# Patient Record
Sex: Female | Born: 1970 | Race: White | Hispanic: No | Marital: Married | Smoking: Never smoker
Health system: Southern US, Community
[De-identification: ages and names within clinical notes are randomized; demographics above are authoritative.]

## PROBLEM LIST (undated history)

## (undated) DIAGNOSIS — I10 Essential (primary) hypertension: Secondary | ICD-10-CM

## (undated) HISTORY — PX: NO PAST SURGERIES: SHX2092

## (undated) HISTORY — PX: COLONOSCOPY: SHX174

---

## 2006-08-31 ENCOUNTER — Ambulatory Visit: Payer: Self-pay | Admitting: Obstetrics and Gynecology

## 2009-01-16 ENCOUNTER — Ambulatory Visit: Payer: Self-pay

## 2009-11-18 ENCOUNTER — Ambulatory Visit: Payer: Self-pay | Admitting: Internal Medicine

## 2010-02-27 ENCOUNTER — Ambulatory Visit: Payer: Self-pay

## 2011-03-05 ENCOUNTER — Ambulatory Visit: Payer: Self-pay

## 2011-03-28 ENCOUNTER — Ambulatory Visit: Payer: Self-pay | Admitting: Neurology

## 2012-03-08 ENCOUNTER — Ambulatory Visit: Payer: Self-pay

## 2013-05-11 ENCOUNTER — Ambulatory Visit: Payer: Self-pay

## 2014-06-06 ENCOUNTER — Ambulatory Visit: Payer: Self-pay

## 2015-06-17 ENCOUNTER — Other Ambulatory Visit: Payer: Self-pay | Admitting: Obstetrics and Gynecology

## 2015-06-17 DIAGNOSIS — Z1231 Encounter for screening mammogram for malignant neoplasm of breast: Secondary | ICD-10-CM

## 2015-06-19 ENCOUNTER — Ambulatory Visit
Admission: RE | Admit: 2015-06-19 | Discharge: 2015-06-19 | Disposition: A | Discharge status: Home / Self Care | Payer: Managed Care, Other (non HMO) | Source: Ambulatory Visit | Attending: Obstetrics and Gynecology | Admitting: Obstetrics and Gynecology

## 2015-06-19 DIAGNOSIS — Z1231 Encounter for screening mammogram for malignant neoplasm of breast: Secondary | ICD-10-CM | POA: Insufficient documentation

## 2016-08-04 ENCOUNTER — Other Ambulatory Visit: Payer: Self-pay | Admitting: Obstetrics and Gynecology

## 2016-08-04 DIAGNOSIS — Z1231 Encounter for screening mammogram for malignant neoplasm of breast: Secondary | ICD-10-CM

## 2016-08-26 ENCOUNTER — Ambulatory Visit
Admission: RE | Admit: 2016-08-26 | Discharge: 2016-08-26 | Disposition: A | Discharge status: Home / Self Care | Payer: BLUE CROSS/BLUE SHIELD | Source: Ambulatory Visit | Attending: Obstetrics and Gynecology | Admitting: Obstetrics and Gynecology

## 2016-08-26 DIAGNOSIS — Z1231 Encounter for screening mammogram for malignant neoplasm of breast: Secondary | ICD-10-CM | POA: Insufficient documentation

## 2017-01-26 ENCOUNTER — Other Ambulatory Visit: Payer: Self-pay | Admitting: Ophthalmology

## 2017-01-26 DIAGNOSIS — H53469 Homonymous bilateral field defects, unspecified side: Secondary | ICD-10-CM

## 2017-01-29 ENCOUNTER — Ambulatory Visit: Payer: BLUE CROSS/BLUE SHIELD

## 2017-02-09 ENCOUNTER — Ambulatory Visit
Admission: RE | Admit: 2017-02-09 | Discharge: 2017-02-09 | Disposition: A | Discharge status: Home / Self Care | Payer: BLUE CROSS/BLUE SHIELD | Source: Ambulatory Visit | Attending: Ophthalmology | Admitting: Ophthalmology

## 2017-02-09 DIAGNOSIS — R9082 White matter disease, unspecified: Secondary | ICD-10-CM | POA: Diagnosis not present

## 2017-02-09 DIAGNOSIS — H53469 Homonymous bilateral field defects, unspecified side: Secondary | ICD-10-CM | POA: Diagnosis present

## 2017-02-09 DIAGNOSIS — Q283 Other malformations of cerebral vessels: Secondary | ICD-10-CM | POA: Diagnosis not present

## 2017-02-09 MED ORDER — GADOBENATE DIMEGLUMINE 529 MG/ML IV SOLN
20.0000 mL | Freq: Once | INTRAVENOUS | Status: AC | PRN
Start: 2017-02-09 — End: 2017-02-09
  Administered 2017-02-09: 20 mL via INTRAVENOUS

## 2017-08-05 ENCOUNTER — Other Ambulatory Visit: Payer: Self-pay | Admitting: Obstetrics and Gynecology

## 2017-08-05 DIAGNOSIS — Z1231 Encounter for screening mammogram for malignant neoplasm of breast: Secondary | ICD-10-CM

## 2017-08-30 ENCOUNTER — Encounter (INDEPENDENT_AMBULATORY_CARE_PROVIDER_SITE_OTHER): Payer: Self-pay

## 2017-08-30 ENCOUNTER — Ambulatory Visit
Admission: RE | Admit: 2017-08-30 | Discharge: 2017-08-30 | Disposition: A | Discharge status: Home / Self Care | Payer: BLUE CROSS/BLUE SHIELD | Source: Ambulatory Visit | Attending: Infectious Diseases | Admitting: Infectious Diseases

## 2017-08-30 DIAGNOSIS — Z1231 Encounter for screening mammogram for malignant neoplasm of breast: Secondary | ICD-10-CM | POA: Diagnosis present

## 2018-03-31 ENCOUNTER — Ambulatory Visit
Admission: EM | Admit: 2018-03-31 | Discharge: 2018-03-31 | Disposition: A | Discharge status: Home / Self Care | Payer: PRIVATE HEALTH INSURANCE | Attending: Family Medicine | Admitting: Family Medicine

## 2018-03-31 DIAGNOSIS — R21 Rash and other nonspecific skin eruption: Secondary | ICD-10-CM | POA: Insufficient documentation

## 2018-03-31 MED ORDER — PREDNISONE 10 MG PO TABS: ORAL_TABLET | ORAL | 0 refills | Status: AC

## 2018-03-31 NOTE — Discharge Instructions (Signed)
Prednisone as prescribed.  Benadryl if needed.  Take care  Dr. Adriana Simas

## 2018-03-31 NOTE — ED Triage Notes (Signed)
Pt reports red rash noted to left neck that started this morning upon waking. Pt describes rash as itchy and uncomfortable

## 2018-03-31 NOTE — ED Provider Notes (Signed)
[facility]    CSN: [id number] Arrival date & time: 03/31/18  1247  History   Chief Complaint Chief Complaint  Patient presents with  . Rash   HPI  48 year old female presents with rash.  Patient reports that she developed rash around her neck this morning.  It has now spread upwards as well as downward to the anterior chest.  Mild itchiness.  She has used some hydrocortisone without improvement.  No known inciting factor.  Patient denies any new exposures.  No reports of shortness of breath.  No other associated symptoms.  Mild to moderate in severity.  No other complaints.  History reviewed and updated as below.  PMH: Obesity, HTN, Migraine, HLD  Home Medications    Prior to Admission medications   Medication Sig Start Date End Date Taking? Authorizing Provider  atenolol (TENORMIN) 25 MG tablet Take by mouth daily.   Yes [provider]  gabapentin (NEURONTIN) 100 MG capsule Take 100 mg by mouth 3 (three) times daily.   Yes [provider]  hydrochlorothiazide (HYDRODIURIL) 25 MG tablet Take 25 mg by mouth daily.   Yes [provider]  topiramate (TOPAMAX) 50 MG tablet Take 50 mg by mouth 3 (three) times daily.   Yes [provider]  TRAZODONE HCL PO Take by mouth.   Yes [provider]  predniSONE (DELTASONE) 10 MG tablet 50 mg daily x 2 days, then 40 mg daily x 2 days, then 30 mg daily x 2 days, then 20 mg daily x 2 days, then 10 mg daily x 2 days. 03/31/18   Tommie Sams, DO    Family History Family History  Problem Relation Age of Onset  . Breast cancer Neg Hx     Social History Social History   Tobacco Use  . Smoking status: Not on file  Substance Use Topics  . Alcohol use: Not on file  . Drug use: Not on file     Allergies   Patient has no known allergies.   Review of Systems Review of Systems  Constitutional: Negative.   Skin: Positive for rash.   Physical Exam Triage Vital Signs ED Triage  Vitals  Enc Vitals Group     BP 03/31/18 1331 139/67     Pulse Rate 03/31/18 1331 70     Resp 03/31/18 1331 16     Temp 03/31/18 1331 98.2 F (36.8 C)     Temp Source 03/31/18 1331 Oral     SpO2 03/31/18 1331 100 %     Weight 03/31/18 1332 230 lb (104.3 kg)     Height 03/31/18 1332 5\' 4"  (1.626 m)     Head Circumference --      Peak Flow --      Pain Score 03/31/18 1331 1     Pain Loc --      Pain Edu? --      Excl. in [place]? --    Updated Vital Signs BP 139/67 (BP Location: Left Arm)   Pulse 70   Temp 98.2 F (36.8 C) (Oral)   Resp 16   Ht 5\' 4"  (1.626 m)   Wt 104.3 kg   SpO2 100%   BMI 39.48 kg/m   Visual Acuity Right Eye Distance:   Left Eye Distance:   Bilateral Distance:    Right Eye Near:   Left Eye Near:    Bilateral Near:     Physical Exam Vitals signs and nursing note reviewed.  Constitutional:  General: She is not in acute distress. HENT:     Head: Normocephalic and atraumatic.     Nose: Nose normal.  Eyes:     General:        Right eye: No discharge.        Left eye: No discharge.     Conjunctiva/sclera: Conjunctivae normal.  Cardiovascular:     Rate and Rhythm: Normal rate and regular rhythm.  Pulmonary:     Effort: Pulmonary effort is normal.     Breath sounds: No wheezing, rhonchi or rales.  Skin:    Comments: Neck, anterior chest with erythematous, raised rash.  Neurological:     Mental Status: She is alert.  Psychiatric:        Behavior: Behavior normal.     Comments: Flat affect.    UC Treatments / Results  Labs (all labs ordered are listed, but only abnormal results are displayed) Labs Reviewed - No data to display  EKG None  Radiology No results found.  Procedures Procedures (including critical care time)  Medications Ordered in UC Medications - No data to display  Initial Impression / Assessment and Plan / UC Course  I have reviewed the triage vital signs and the nursing notes.  Pertinent labs & imaging results  that were available during my care of the patient were reviewed by me and considered in my medical decision making (see chart for details).    [age] year old female presents with rash.  Appears to be contact or allergic.  Placing on prednisone.  Benadryl as needed.  Final Clinical Impressions(s) / UC Diagnoses   Final diagnoses:  Rash     Discharge Instructions     Prednisone as prescribed.  Benadryl if needed.  Take care  Dr. Adriana Simasook    ED Prescriptions    Medication Sig Dispense Auth. Provider   predniSONE (DELTASONE) 10 MG tablet 50 mg daily x 2 days, then 40 mg daily x 2 days, then 30 mg daily x 2 days, then 20 mg daily x 2 days, then 10 mg daily x 2 days. 30 tablet Tommie Samsook, Damian Hofstra G, DO     Controlled Substance Prescriptions [place] Controlled Substance Registry consulted? Not Applicable   Tommie SamsCook, Ramonita Koenig G, DO 03/31/18 1641

## 2018-09-06 ENCOUNTER — Other Ambulatory Visit: Payer: Self-pay | Admitting: Obstetrics & Gynecology

## 2018-09-06 ENCOUNTER — Other Ambulatory Visit: Payer: Self-pay | Admitting: Obstetrics and Gynecology

## 2018-09-06 DIAGNOSIS — Z1231 Encounter for screening mammogram for malignant neoplasm of breast: Secondary | ICD-10-CM

## 2018-09-13 ENCOUNTER — Ambulatory Visit
Admission: RE | Admit: 2018-09-13 | Discharge: 2018-09-13 | Disposition: A | Discharge status: Home / Self Care | Payer: PRIVATE HEALTH INSURANCE | Source: Ambulatory Visit | Attending: Obstetrics & Gynecology | Admitting: Obstetrics & Gynecology

## 2018-09-13 ENCOUNTER — Encounter (INDEPENDENT_AMBULATORY_CARE_PROVIDER_SITE_OTHER): Payer: Self-pay

## 2018-09-13 ENCOUNTER — Other Ambulatory Visit: Payer: Self-pay

## 2018-09-13 DIAGNOSIS — Z1231 Encounter for screening mammogram for malignant neoplasm of breast: Secondary | ICD-10-CM | POA: Insufficient documentation

## 2019-01-17 ENCOUNTER — Other Ambulatory Visit: Payer: Self-pay

## 2019-01-17 ENCOUNTER — Ambulatory Visit
Admission: AD | Admit: 2019-01-17 | Discharge: 2019-01-17 | Disposition: A | Discharge status: Home / Self Care | Payer: PRIVATE HEALTH INSURANCE | Attending: Family Medicine | Admitting: Family Medicine

## 2019-01-17 DIAGNOSIS — K529 Noninfective gastroenteritis and colitis, unspecified: Secondary | ICD-10-CM

## 2019-01-17 NOTE — Discharge Instructions (Addendum)
Rest, fluids, over the counter Imodium AD 

## 2019-01-17 NOTE — ED Triage Notes (Signed)
Patient complains of fever and diarrhea that started yesterday. Patient states that she was told by work she cannot come back to work until checked out and covid swabbed.

## 2019-01-17 NOTE — ED Provider Notes (Signed)
[facility]    CSN: [id number] Arrival date & time: 01/17/19  1738      History   Chief Complaint Chief Complaint  Patient presents with  . Fever    HPI Jane Rhodes is a 48 y.o. female.   48 yo female with a c/o fever and diarrhea since yesterday. Denies any other symptoms. Denies congestion, cough, shortness of breath, vomiting, melena, hematochezia. No known sick contacts.    Fever   History reviewed. No pertinent past medical history.  There are no active problems to display for this patient.   Past Surgical History:  Procedure Laterality Date  . NO PAST SURGERIES      OB History   No obstetric history on file.      Home Medications    Prior to Admission medications   Medication Sig Start Date End Date Taking? Authorizing Provider  atenolol (TENORMIN) 25 MG tablet Take by mouth daily.   Yes [provider]  gabapentin (NEURONTIN) 100 MG capsule Take 100 mg by mouth 3 (three) times daily.   Yes [provider]  hydrochlorothiazide (HYDRODIURIL) 25 MG tablet Take 25 mg by mouth daily.   Yes [provider]  topiramate (TOPAMAX) 50 MG tablet Take 50 mg by mouth 3 (three) times daily.   Yes [provider]  TRAZODONE HCL PO Take by mouth.   Yes [provider]  predniSONE (DELTASONE) 10 MG tablet 50 mg daily x 2 days, then 40 mg daily x 2 days, then 30 mg daily x 2 days, then 20 mg daily x 2 days, then 10 mg daily x 2 days. 03/31/18   Coral Spikes, DO    Family History Family History  Problem Relation Age of Onset  . Breast cancer Neg Hx     Social History Social History   Tobacco Use  . Smoking status: Never Smoker  . Smokeless tobacco: Never Used  Substance Use Topics  . Alcohol use: Never    Frequency: Never  . Drug use: Never     Allergies   Patient has no known allergies.   Review of Systems Review of Systems  Constitutional: Positive for fever.     Physical Exam  Triage Vital Signs ED Triage Vitals  Enc Vitals Group     BP 01/17/19 1751 131/73     Pulse Rate 01/17/19 1751 87     Resp 01/17/19 1751 16     Temp 01/17/19 1751 98 F (36.7 C)     Temp Source 01/17/19 1751 Oral     SpO2 01/17/19 1751 100 %     Weight 01/17/19 1749 225 lb (102.1 kg)     Height 01/17/19 1749 5\' 4"  (1.626 m)     Head Circumference --      Peak Flow --      Pain Score 01/17/19 1749 0     Pain Loc --      Pain Edu? --      Excl. in [place]? --    No data found.  Updated Vital Signs BP 131/73 (BP Location: Right Arm)   Pulse 87   Temp 98 F (36.7 C) (Oral)   Resp 16   Ht 5\' 4"  (1.626 m)   Wt 102.1 kg   SpO2 100%   BMI 38.62 kg/m   Visual Acuity Right Eye Distance:   Left Eye Distance:   Bilateral Distance:    Right Eye Near:   Left Eye Near:  Bilateral Near:     Physical Exam Vitals signs and nursing note reviewed.  Constitutional:      General: She is not in acute distress.    Appearance: She is not toxic-appearing or diaphoretic.  Cardiovascular:     Rate and Rhythm: Normal rate and regular rhythm.     Heart sounds: Normal heart sounds.  Pulmonary:     Effort: Pulmonary effort is normal. No respiratory distress.     Breath sounds: Normal breath sounds. No stridor. No wheezing, rhonchi or rales.  Abdominal:     General: Bowel sounds are normal. There is no distension.     Palpations: Abdomen is soft. There is no mass.     Tenderness: There is no abdominal tenderness. There is no right CVA tenderness, left CVA tenderness, guarding or rebound.     Hernia: No hernia is present.  Neurological:     Mental Status: She is alert.      UC Treatments / Results  Labs (all labs ordered are listed, but only abnormal results are displayed) Labs Reviewed  NOVEL CORONAVIRUS, NAA (HOSP ORDER, SEND-OUT TO REF LAB; TAT 18-24 HRS)    EKG   Radiology No results found.  Procedures Procedures (including critical care time)  Medications Ordered in  UC Medications - No data to display  Initial Impression / Assessment and Plan / UC Course  I have reviewed the triage vital signs and the nursing notes.  Pertinent labs & imaging results that were available during my care of the patient were reviewed by me and considered in my medical decision making (see chart for details).     Final Clinical Impressions(s) / UC Diagnoses   Final diagnoses:  Gastroenteritis     Discharge Instructions     Rest, fluids, over the counter Imodium AD    ED Prescriptions    None      1. diagnosis reviewed with patient 2. Recommend supportive treatment as above 3. covid test done 4. Follow-up prn if symptoms worsen or don't improve  PDMP not reviewed this encounter.   Payton Mccallum, MD 01/17/19 1900

## 2019-01-19 LAB — NOVEL CORONAVIRUS, NAA (HOSP ORDER, SEND-OUT TO REF LAB; TAT 18-24 HRS): SARS-CoV-2, NAA: NOT DETECTED

## 2019-05-12 ENCOUNTER — Ambulatory Visit: Payer: PRIVATE HEALTH INSURANCE | Attending: Internal Medicine

## 2019-05-12 ENCOUNTER — Ambulatory Visit: Payer: PRIVATE HEALTH INSURANCE

## 2019-05-12 DIAGNOSIS — Z23 Encounter for immunization: Secondary | ICD-10-CM

## 2019-05-12 NOTE — Progress Notes (Signed)
   Covid-19 Vaccination Clinic  Name:  Jane Rhodes    MRN: [id number] DOB: 16-Dec-1970  05/12/2019  [name] was observed post Covid-19 immunization for 15 minutes without incidence. She was provided with Vaccine Information Sheet and instruction to access the V-Safe system.   [name] was instructed to call 911 with any severe reactions post vaccine: Marland Kitchen Difficulty breathing  . Swelling of your face and throat  . A fast heartbeat  . A bad rash all over your body  . Dizziness and weakness    Immunizations Administered    Name Date Dose VIS Date Route   Pfizer COVID-19 Vaccine 05/12/2019  8:29 AM 0.3 mL 03/10/2019 Intramuscular   Manufacturer: [employer], [employer]   Lot: [id number]   NDC: [address]-5

## 2019-06-02 ENCOUNTER — Ambulatory Visit: Payer: PRIVATE HEALTH INSURANCE

## 2019-06-06 ENCOUNTER — Ambulatory Visit: Payer: PRIVATE HEALTH INSURANCE | Attending: Internal Medicine

## 2019-06-06 DIAGNOSIS — Z23 Encounter for immunization: Secondary | ICD-10-CM | POA: Insufficient documentation

## 2019-06-06 NOTE — Progress Notes (Signed)
   Covid-19 Vaccination Clinic  Name:  Jane Rhodes    MRN: [id number] DOB: 1970-10-20  06/06/2019  [name] was observed post Covid-19 immunization for 15 minutes without incident. She was provided with Vaccine Information Sheet and instruction to access the V-Safe system.   [name] was instructed to call 911 with any severe reactions post vaccine: Marland Kitchen Difficulty breathing  . Swelling of face and throat  . A fast heartbeat  . A bad rash all over body  . Dizziness and weakness   Immunizations Administered    Name Date Dose VIS Date Route   Pfizer COVID-19 Vaccine 06/06/2019  8:28 AM 0.3 mL 03/10/2019 Intramuscular   Manufacturer: [employer], [employer]   Lot: [id number]   NDC: [address]-3

## 2019-10-12 ENCOUNTER — Other Ambulatory Visit: Payer: Self-pay | Admitting: Obstetrics & Gynecology

## 2019-10-12 DIAGNOSIS — Z1231 Encounter for screening mammogram for malignant neoplasm of breast: Secondary | ICD-10-CM

## 2019-10-17 ENCOUNTER — Ambulatory Visit
Admission: RE | Admit: 2019-10-17 | Discharge: 2019-10-17 | Disposition: A | Discharge status: Home / Self Care | Payer: PRIVATE HEALTH INSURANCE | Source: Ambulatory Visit | Attending: Obstetrics & Gynecology | Admitting: Obstetrics & Gynecology

## 2019-10-17 ENCOUNTER — Other Ambulatory Visit: Payer: Self-pay

## 2019-10-17 DIAGNOSIS — Z1231 Encounter for screening mammogram for malignant neoplasm of breast: Secondary | ICD-10-CM | POA: Diagnosis present

## 2020-12-12 ENCOUNTER — Other Ambulatory Visit: Payer: Self-pay | Admitting: Certified Nurse Midwife

## 2020-12-12 DIAGNOSIS — Z1231 Encounter for screening mammogram for malignant neoplasm of breast: Secondary | ICD-10-CM

## 2020-12-26 ENCOUNTER — Other Ambulatory Visit: Payer: Self-pay

## 2020-12-26 ENCOUNTER — Ambulatory Visit
Admission: RE | Admit: 2020-12-26 | Discharge: 2020-12-26 | Disposition: A | Discharge status: Home / Self Care | Payer: 59 | Source: Ambulatory Visit | Attending: Certified Nurse Midwife | Admitting: Certified Nurse Midwife

## 2020-12-26 DIAGNOSIS — Z1231 Encounter for screening mammogram for malignant neoplasm of breast: Secondary | ICD-10-CM | POA: Diagnosis present

## 2021-01-06 ENCOUNTER — Other Ambulatory Visit: Payer: Self-pay | Admitting: Certified Nurse Midwife

## 2021-01-06 DIAGNOSIS — N6489 Other specified disorders of breast: Secondary | ICD-10-CM

## 2021-01-06 DIAGNOSIS — R928 Other abnormal and inconclusive findings on diagnostic imaging of breast: Secondary | ICD-10-CM

## 2021-01-14 ENCOUNTER — Ambulatory Visit
Admission: RE | Admit: 2021-01-14 | Discharge: 2021-01-14 | Disposition: A | Discharge status: Home / Self Care | Payer: 59 | Source: Ambulatory Visit | Attending: Certified Nurse Midwife | Admitting: Certified Nurse Midwife

## 2021-01-14 ENCOUNTER — Other Ambulatory Visit: Payer: Self-pay

## 2021-01-14 DIAGNOSIS — N6489 Other specified disorders of breast: Secondary | ICD-10-CM | POA: Diagnosis present

## 2021-01-14 DIAGNOSIS — R928 Other abnormal and inconclusive findings on diagnostic imaging of breast: Secondary | ICD-10-CM | POA: Diagnosis present

## 2021-12-17 ENCOUNTER — Other Ambulatory Visit: Payer: Self-pay | Admitting: Internal Medicine

## 2021-12-17 ENCOUNTER — Other Ambulatory Visit: Payer: Self-pay | Admitting: Certified Nurse Midwife

## 2021-12-17 DIAGNOSIS — Z1231 Encounter for screening mammogram for malignant neoplasm of breast: Secondary | ICD-10-CM

## 2022-01-20 ENCOUNTER — Ambulatory Visit
Admission: RE | Admit: 2022-01-20 | Discharge: 2022-01-20 | Disposition: A | Discharge status: Home / Self Care | Payer: 59 | Source: Ambulatory Visit | Attending: Certified Nurse Midwife | Admitting: Certified Nurse Midwife

## 2022-01-20 DIAGNOSIS — Z1231 Encounter for screening mammogram for malignant neoplasm of breast: Secondary | ICD-10-CM | POA: Insufficient documentation

## 2022-06-25 IMAGING — US US BREAST*L* LIMITED INC AXILLA
1 series · 11 of 11 positions shown · non-contrast
Comparison: Previous exam(s).

CLINICAL DATA: 50-year-old female for further evaluation of
possible LEFT breast mass on screening mammogram.

EXAM:
DIGITAL DIAGNOSTIC UNILATERAL LEFT MAMMOGRAM WITH TOMOSYNTHESIS AND
CAD; ULTRASOUND LEFT BREAST LIMITED
TECHNIQUE: Left digital diagnostic mammography and breast tomosynthesis was
performed. The images were evaluated with computer-aided detection.;
Targeted ultrasound examination of the left breast was performed.

[Series 1: us breast*left* limited inc axilla · 0.08mm/px · 11 of 11 slices shown]
[im 1/11]
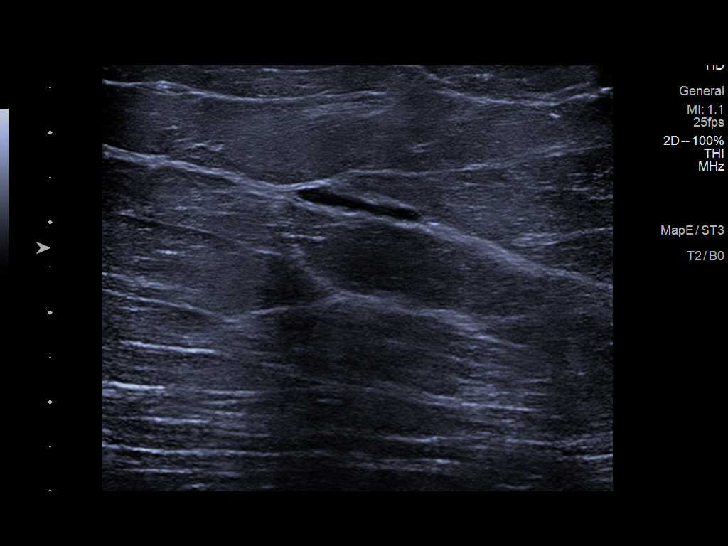
[im 2/11]
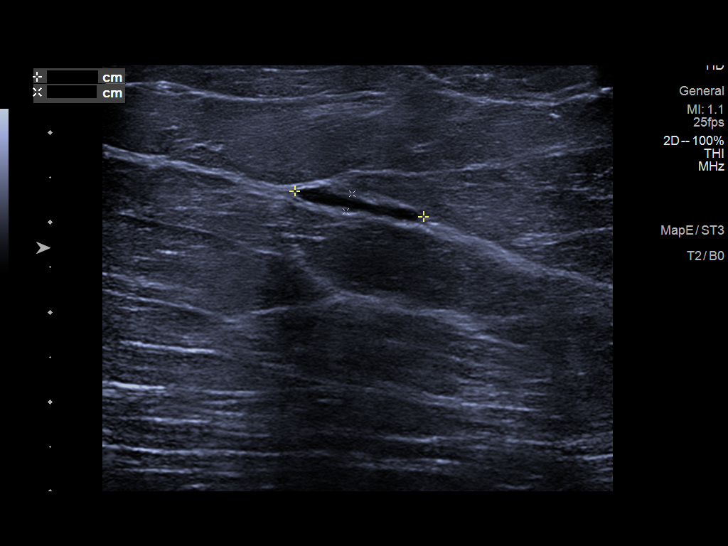
[im 3/11]
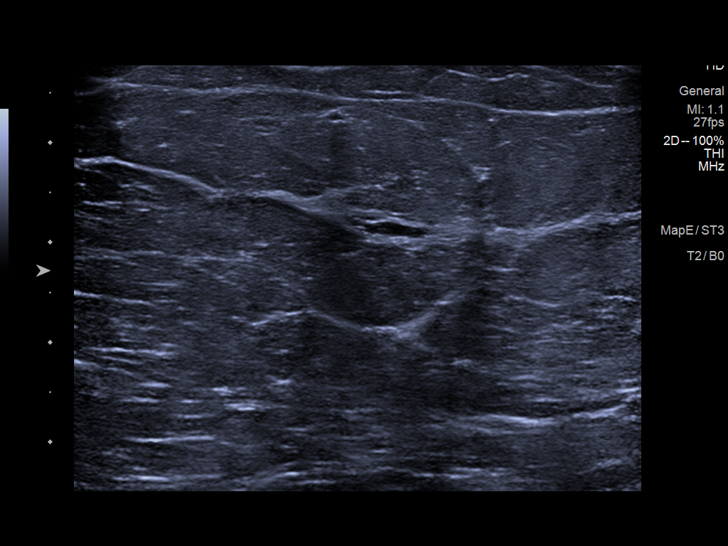
[im 4/11]
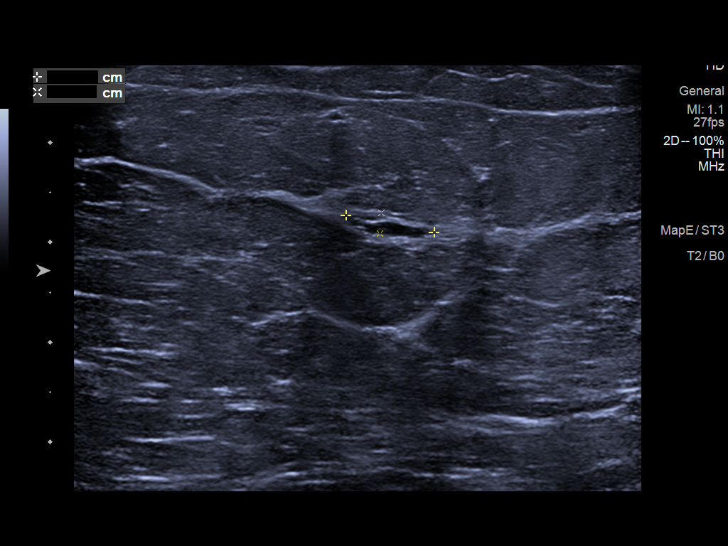
[im 5/11]
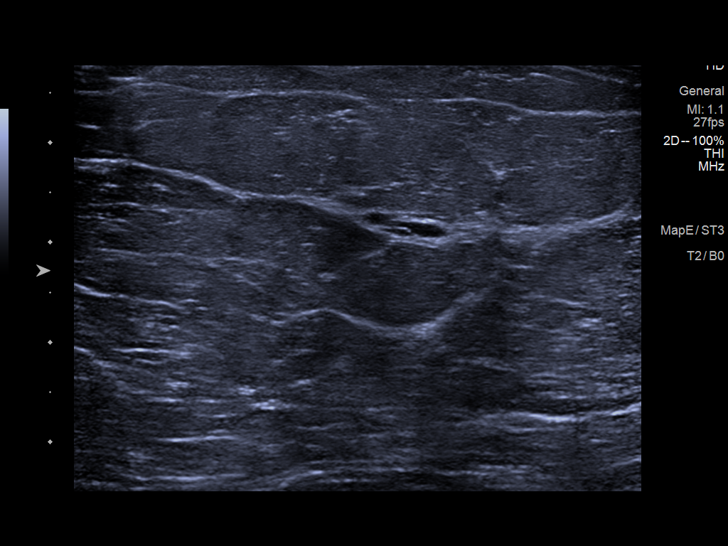
[im 6/11]
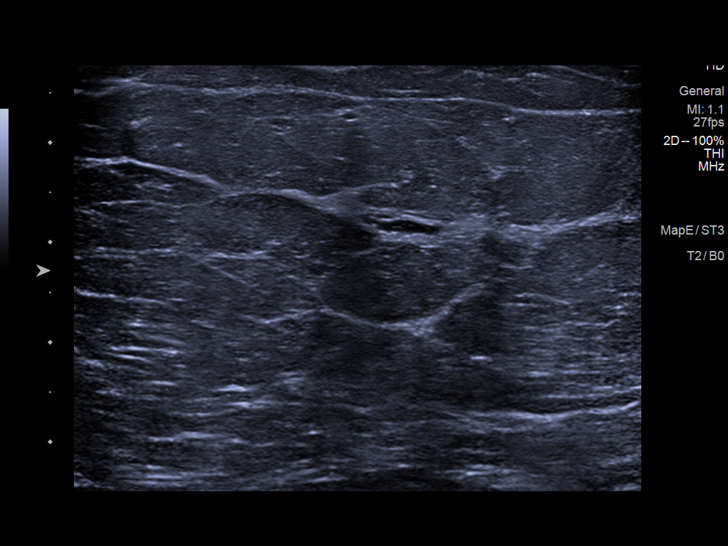
[im 7/11]
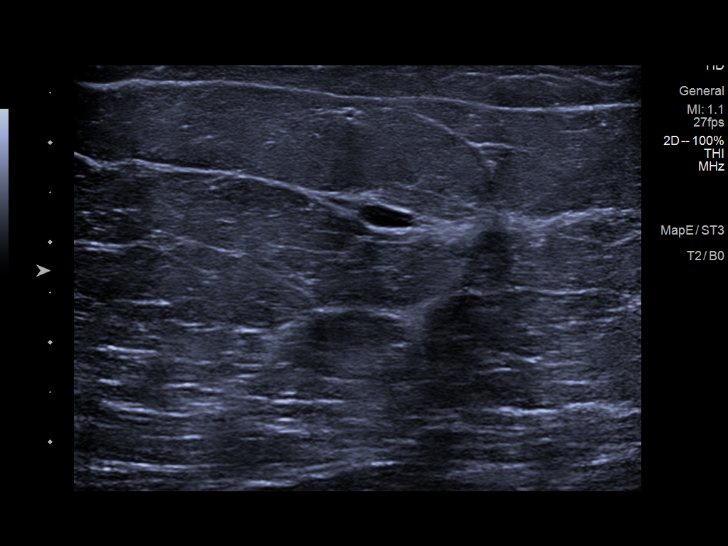
[im 8/11]
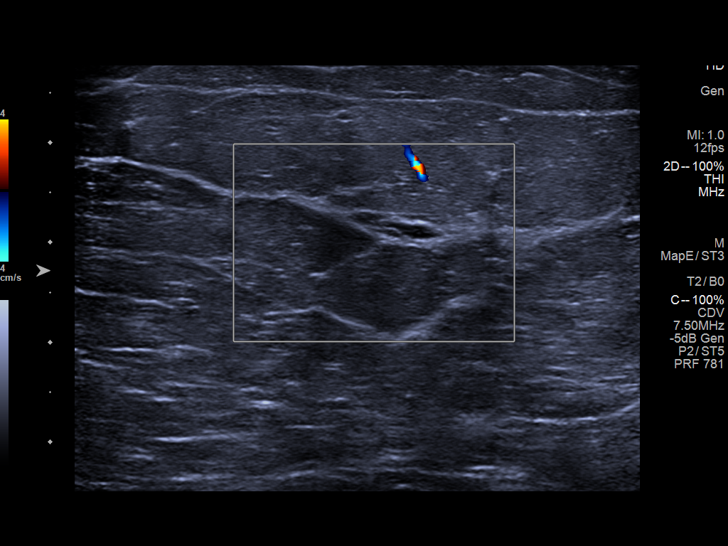
[im 9/11]
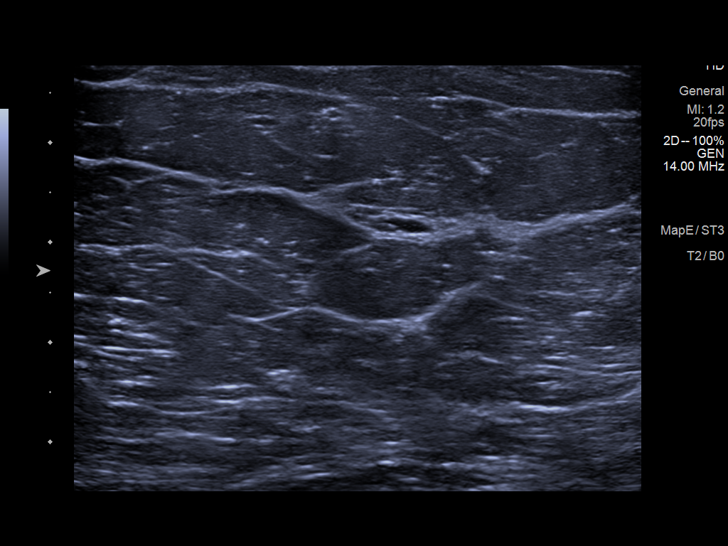
[im 10/11]
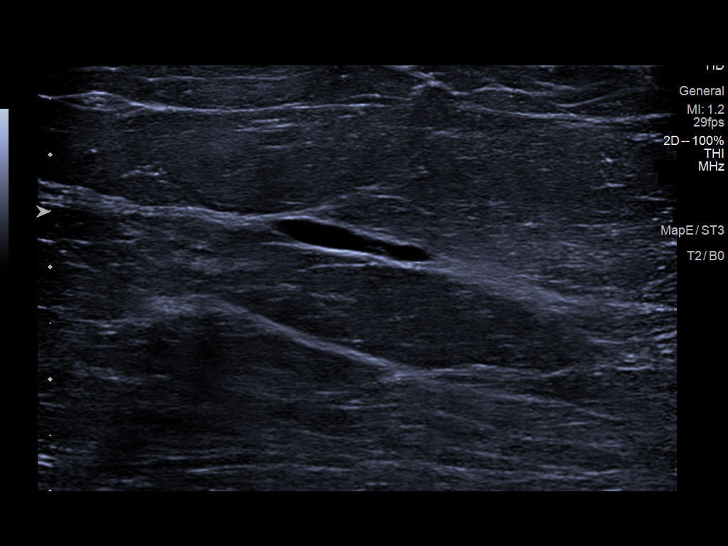
[im 11/11]
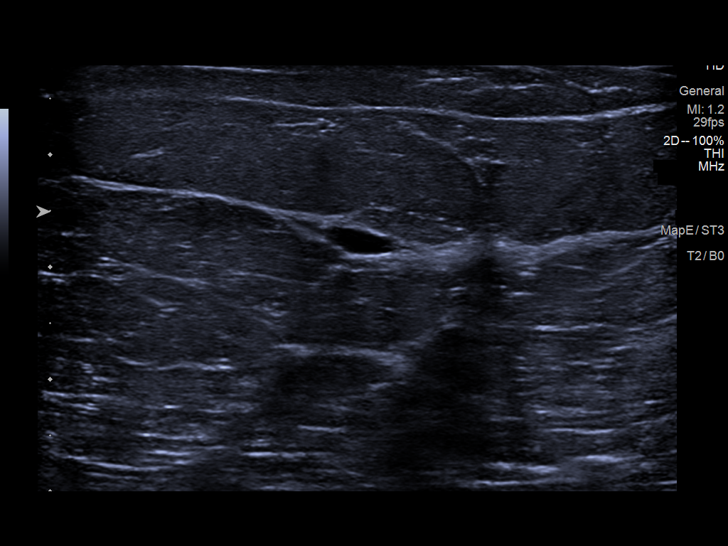

[11 of 11 positions shown; findings below may reference images not displayed]

ACR Breast Density Category b: There are scattered areas of
fibroglandular density.
FINDINGS: 2D/3D full field and spot compression views of the LEFT breast
demonstrate a persistent low-density circumscribed oval mass within
the UPPER-OUTER LEFT breast, middle depth.

Targeted ultrasound is performed, showing a 1.5 x 0.2 x 0.9 cm
benign cyst at the 1 o'clock position of the LEFT breast 13 cm from
the nipple, corresponding to the screening study finding.
IMPRESSION: Benign cyst within the UPPER-OUTER LEFT breast corresponding to the
screening study finding.

RECOMMENDATION:
Bilateral screening mammogram in 1 year.

I have discussed the findings and recommendations with the patient.
If applicable, a reminder letter will be sent to the patient
regarding the next appointment.

BI-RADS CATEGORY  2: Benign.

## 2022-12-15 ENCOUNTER — Encounter: Payer: Self-pay | Admitting: Internal Medicine

## 2022-12-16 ENCOUNTER — Ambulatory Visit
Admission: RE | Admit: 2022-12-16 | Discharge: 2022-12-16 | Disposition: A | Discharge status: Home / Self Care | Payer: 59 | Attending: Internal Medicine | Admitting: Internal Medicine

## 2022-12-16 ENCOUNTER — Encounter
Admission: RE | Disposition: A | Discharge status: Home / Self Care | Payer: Self-pay | Source: Home / Self Care | Attending: Internal Medicine

## 2022-12-16 ENCOUNTER — Ambulatory Visit: Payer: 59 | Admitting: Certified Registered"

## 2022-12-16 ENCOUNTER — Encounter: Payer: Self-pay | Admitting: Internal Medicine

## 2022-12-16 DIAGNOSIS — D123 Benign neoplasm of transverse colon: Secondary | ICD-10-CM | POA: Insufficient documentation

## 2022-12-16 DIAGNOSIS — I10 Essential (primary) hypertension: Secondary | ICD-10-CM | POA: Insufficient documentation

## 2022-12-16 DIAGNOSIS — Z1211 Encounter for screening for malignant neoplasm of colon: Secondary | ICD-10-CM | POA: Insufficient documentation

## 2022-12-16 DIAGNOSIS — Z86010 Personal history of colonic polyps: Secondary | ICD-10-CM | POA: Insufficient documentation

## 2022-12-16 HISTORY — PX: POLYPECTOMY: SHX5525

## 2022-12-16 HISTORY — PX: COLONOSCOPY WITH PROPOFOL: SHX5780

## 2022-12-16 HISTORY — DX: Essential (primary) hypertension: I10

## 2022-12-16 SURGERY — COLONOSCOPY WITH PROPOFOL
Anesthesia: General

## 2022-12-16 MED ORDER — SODIUM CHLORIDE 0.9 % IV SOLN: INTRAVENOUS | Status: DC

## 2022-12-16 MED ORDER — SIMETHICONE 40 MG/0.6ML PO SUSP
ORAL | Status: DC | PRN
Start: 2022-12-16 — End: 2022-12-16
  Administered 2022-12-16: 60 mL

## 2022-12-16 MED ORDER — PROPOFOL 1000 MG/100ML IV EMUL
INTRAVENOUS | Status: AC
  Filled 2022-12-16: qty 100

## 2022-12-16 MED ORDER — PHENYLEPHRINE HCL (PRESSORS) 10 MG/ML IV SOLN
INTRAVENOUS | Status: DC | PRN
  Administered 2022-12-16 (×2): 80 ug via INTRAVENOUS

## 2022-12-16 MED ORDER — PROPOFOL 10 MG/ML IV BOLUS
INTRAVENOUS | Status: AC
  Filled 2022-12-16: qty 20

## 2022-12-16 MED ORDER — SODIUM CHLORIDE 0.9 % IV SOLN
INTRAVENOUS | Status: DC | PRN
Start: 2022-12-16 — End: 2022-12-16

## 2022-12-16 MED ORDER — ONDANSETRON HCL 4 MG/2ML IJ SOLN
INTRAMUSCULAR | Status: DC | PRN
  Administered 2022-12-16: 4 mg via INTRAVENOUS

## 2022-12-16 MED ORDER — PROPOFOL 500 MG/50ML IV EMUL
INTRAVENOUS | Status: DC | PRN
  Administered 2022-12-16: 30 mg via INTRAVENOUS
  Administered 2022-12-16: 50 mg via INTRAVENOUS
  Administered 2022-12-16: 175 ug/kg/min via INTRAVENOUS

## 2022-12-16 NOTE — Anesthesia Preprocedure Evaluation (Addendum)
Anesthesia Evaluation  Patient identified by MRN, date of birth, ID band Patient awake    Reviewed: Allergy & Precautions, NPO status , Patient's Chart, lab work & pertinent test results  History of Anesthesia Complications Negative for: history of anesthetic complications  Airway Mallampati: IV  TM Distance: >3 FB Neck ROM: full    Dental no notable dental hx.    Pulmonary neg pulmonary ROS   Pulmonary exam normal        Cardiovascular hypertension, On Medications and On Home Beta Blockers Normal cardiovascular exam     Neuro/Psych negative neurological ROS  negative psych ROS   GI/Hepatic negative GI ROS, Neg liver ROS,,,  Endo/Other  negative endocrine ROS    Renal/GU negative Renal ROS  negative genitourinary   Musculoskeletal   Abdominal   Peds  Hematology negative hematology ROS (+)   Anesthesia Other Findings Past Medical History: No date: Hypertension  Past Surgical History: No date: COLONOSCOPY No date: NO PAST SURGERIES  BMI 38     Reproductive/Obstetrics negative OB ROS                             Anesthesia Physical Anesthesia Plan  ASA: 2  Anesthesia Plan: General   Post-op Pain Management: Minimal or no pain anticipated   Induction: Intravenous  PONV Risk Score and Plan: 2 and Propofol infusion and TIVA  Airway Management Planned: Natural Airway and Nasal Cannula  Additional Equipment:   Intra-op Plan:   Post-operative Plan:   Informed Consent: I have reviewed the patients History and Physical, chart, labs and discussed the procedure including the risks, benefits and alternatives for the proposed anesthesia with the patient or authorized representative who has indicated his/her understanding and acceptance.     Dental Advisory Given  Plan Discussed with: Anesthesiologist, CRNA and Surgeon  Anesthesia Plan Comments: (Patient consented for risks  of anesthesia including but not limited to:  - adverse reactions to medications - risk of airway placement if required - damage to eyes, teeth, lips or other oral mucosa - nerve damage due to positioning  - sore throat or hoarseness - Damage to heart, brain, nerves, lungs, other parts of body or loss of life  Patient voiced understanding.)       Anesthesia Quick Evaluation

## 2022-12-16 NOTE — Transfer of Care (Signed)
Immediate Anesthesia Transfer of Care Note  Patient: Jane Rhodes  Procedure(s) Performed: COLONOSCOPY WITH PROPOFOL POLYPECTOMY  Patient Location: PACU  Anesthesia Type:MAC  Level of Consciousness: drowsy  Airway & Oxygen Therapy: Patient Spontanous Breathing and Patient connected to face mask oxygen  Post-op Assessment: Report given to RN and Post -op Vital signs reviewed and stable  Post vital signs: Reviewed  Last Vitals:  Vitals Value Taken Time  BP 109/56 12/16/22 0858  Temp 35.6 C 12/16/22 0853  Pulse 64 12/16/22 0901  Resp 18 12/16/22 0901  SpO2 99 % 12/16/22 0901  Vitals shown include unfiled device data.  Last Pain:  Vitals:   12/16/22 0853  TempSrc: Temporal  PainSc: Asleep         Complications: No notable events documented.

## 2022-12-16 NOTE — H&P (Signed)
Outpatient short stay form Pre-procedure 12/16/2022 8:29 AM Jane Rhodes K. [name], M.D.  Primary Physician: Marcelino Duster, M.D.  Reason for visit:  Personal history of adenomatous colon polyps  History of present illness:                            Patient presents for colonoscopy for a personal hx of colon polyps. The patient denies abdominal pain, abnormal weight loss or rectal bleeding.      Current Facility-Administered Medications:    0.9 %  sodium chloride infusion, , Intravenous, Continuous, [place], Boykin Nearing, MD, Last Rate: 20 mL/hr at 12/16/22 0737, New Bag at 12/16/22 0737  Medications Prior to Admission  Medication Sig Dispense Refill Last Dose   atenolol (TENORMIN) 25 MG tablet Take by mouth daily.   12/15/2022   gabapentin (NEURONTIN) 100 MG capsule Take 100 mg by mouth 3 (three) times daily.   12/15/2022   hydrochlorothiazide (HYDRODIURIL) 25 MG tablet Take 25 mg by mouth daily.   12/16/2022 at 0550   topiramate (TOPAMAX) 50 MG tablet Take 50 mg by mouth 3 (three) times daily.   12/15/2022   predniSONE (DELTASONE) 10 MG tablet 50 mg daily x 2 days, then 40 mg daily x 2 days, then 30 mg daily x 2 days, then 20 mg daily x 2 days, then 10 mg daily x 2 days. 30 tablet 0    TRAZODONE HCL PO Take by mouth.        No Known Allergies   Past Medical History:  Diagnosis Date   Hypertension     Review of systems:  Otherwise negative.    Physical Exam  Gen: Alert, oriented. Appears stated age.  HEENT: Jane Rhodes/AT. PERRLA. Lungs: CTA, no wheezes. CV: RR nl S1, S2. Abd: soft, benign, no masses. BS+ Ext: No edema. Pulses 2+    Planned procedures: Proceed with colonoscopy. The patient understands the nature of the planned procedure, indications, risks, alternatives and potential complications including but not limited to bleeding, infection, perforation, damage to internal organs and possible oversedation/side effects from anesthesia. The patient agrees and gives consent to proceed.   Please refer to procedure notes for findings, recommendations and patient disposition/instructions.     Jane Rhodes K. [name], M.D. Gastroenterology 12/16/2022  8:29 AM

## 2022-12-16 NOTE — Interval H&P Note (Signed)
History and Physical Interval Note:  12/16/2022 8:30 AM  Jane Rhodes  has presented today for surgery, with the diagnosis of Z86.010 (ICD-10-CM) - History of adenomatous polyp of colon.  The various methods of treatment have been discussed with the patient and family. After consideration of risks, benefits and other options for treatment, the patient has consented to  Procedure(s): COLONOSCOPY WITH PROPOFOL (N/A) as a surgical intervention.  The patient's history has been reviewed, patient examined, no change in status, stable for surgery.  I have reviewed the patient's chart and labs.  Questions were answered to the patient's satisfaction.     [place], [place]

## 2022-12-16 NOTE — Op Note (Addendum)
[facility] Gastroenterology Patient Name: Jane Rhodes Procedure Date: 12/16/2022 7:11 AM MRN: [id number] Account #: [id number] Date of Birth: November 19, 1970 Admit Type: Outpatient Age: 52 Room: [facility] ENDO ROOM 2 Gender: Female Note Status: Finalized Instrument Name: Nelda Marseille [id number] Procedure:             Colonoscopy Indications:           High risk colon cancer surveillance: Personal history                         of non-advanced adenoma Providers:             Boykin Nearing. [name] MD, MD Referring MD:          Gracelyn Nurse, MD (Referring MD) Medicines:             Propofol per Anesthesia Complications:         No immediate complications. Procedure:             Pre-Anesthesia Assessment:                        - The risks and benefits of the procedure and the                         sedation options and risks were discussed with the                         patient. All questions were answered and informed                         consent was obtained.                        - Patient identification and proposed procedure were                         verified prior to the procedure by the nurse. The                         procedure was verified in the procedure room.                        - ASA Grade Assessment: II - A patient with mild                         systemic disease.                        - After reviewing the risks and benefits, the patient                         was deemed in satisfactory condition to undergo the                         procedure.                        After obtaining informed consent, the colonoscope was                         passed under  direct vision. Throughout the procedure,                         the patient's blood pressure, pulse, and oxygen                         saturations were monitored continuously. The                         Colonoscope was introduced through the anus and                         advanced to  the the cecum, identified by appendiceal                         orifice and ileocecal valve. The colonoscopy was                         performed without difficulty. The patient tolerated                         the procedure well. The quality of the bowel                         preparation was good. The ileocecal valve, appendiceal                         orifice, and rectum were photographed. Findings:      The perianal and digital rectal examinations were normal. Pertinent       negatives include normal sphincter tone and no palpable rectal lesions.      A 5 mm polyp was found in the transverse colon. The polyp was sessile.       The polyp was removed with a jumbo cold forceps. Resection and retrieval       were complete.      The exam was otherwise without abnormality on direct and retroflexion       views. Impression:            - One 5 mm polyp in the transverse colon, removed with                         a jumbo cold forceps. Resected and retrieved.                        - The examination was otherwise normal on direct and                         retroflexion views. Recommendation:        - Patient has a contact number available for                         emergencies. The signs and symptoms of potential                         delayed complications were discussed with the patient.                         Return to normal activities tomorrow. Written  discharge instructions were provided to the patient.                        - Resume previous diet.                        - Continue present medications.                        - Repeat colonoscopy in 5 years for surveillance.                        - Family history of colon cancer in a first degree                         relative.                        - Return to GI office PRN.                        - The findings and recommendations were discussed with                         the patient. Procedure  Code(s):     --- Professional ---                        [id number], Colonoscopy, flexible; with biopsy, single or                         multiple Diagnosis Code(s):     --- Professional ---                        D12.3, Benign neoplasm of transverse colon (hepatic                         flexure or splenic flexure)                        Z86.010, Personal history of colonic polyps CPT copyright 2022 [employer]. All rights reserved. The codes documented in this report are preliminary and upon coder review may  be revised to meet current compliance requirements. Stanton Kidney MD, MD 12/16/2022 8:52:36 AM This report has been signed electronically. Number of Addenda: 0 Note Initiated On: 12/16/2022 7:11 AM Scope Withdrawal Time: 0 hours 7 minutes 18 seconds  Total Procedure Duration: 0 hours 11 minutes 59 seconds  Estimated Blood Loss:  Estimated blood loss: none.      [facility]

## 2022-12-16 NOTE — Anesthesia Postprocedure Evaluation (Signed)
Anesthesia Post Note  Patient: Jane Rhodes  Procedure(s) Performed: COLONOSCOPY WITH PROPOFOL POLYPECTOMY  Patient location during evaluation: Endoscopy Anesthesia Type: General Level of consciousness: awake and alert Pain management: pain level controlled Vital Signs Assessment: post-procedure vital signs reviewed and stable Respiratory status: spontaneous breathing, nonlabored ventilation, respiratory function stable and patient connected to nasal cannula oxygen Cardiovascular status: blood pressure returned to baseline and stable Postop Assessment: no apparent nausea or vomiting Anesthetic complications: no   No notable events documented.   Last Vitals:  Vitals:   12/16/22 0853 12/16/22 0903  BP: (!) 109/56 111/60  Pulse: 65 (!) 59  Resp: 18 15  Temp: (!) 35.6 C   SpO2: 100% 100%    Last Pain:  Vitals:   12/16/22 0903  TempSrc:   PainSc: 0-No pain                 Louie Boston

## 2022-12-17 ENCOUNTER — Encounter: Payer: Self-pay | Admitting: Internal Medicine

## 2022-12-17 LAB — SURGICAL PATHOLOGY

## 2022-12-21 ENCOUNTER — Other Ambulatory Visit: Payer: Self-pay | Admitting: Certified Nurse Midwife

## 2022-12-21 DIAGNOSIS — Z1231 Encounter for screening mammogram for malignant neoplasm of breast: Secondary | ICD-10-CM

## 2023-01-26 ENCOUNTER — Ambulatory Visit
Admission: RE | Admit: 2023-01-26 | Discharge: 2023-01-26 | Disposition: A | Discharge status: Home / Self Care | Payer: 59 | Source: Ambulatory Visit | Attending: Certified Nurse Midwife | Admitting: Certified Nurse Midwife

## 2023-01-26 DIAGNOSIS — Z1231 Encounter for screening mammogram for malignant neoplasm of breast: Secondary | ICD-10-CM | POA: Insufficient documentation

## 2023-12-27 ENCOUNTER — Other Ambulatory Visit: Payer: Self-pay | Admitting: Certified Nurse Midwife

## 2023-12-27 DIAGNOSIS — Z1231 Encounter for screening mammogram for malignant neoplasm of breast: Secondary | ICD-10-CM

## 2024-01-28 ENCOUNTER — Ambulatory Visit
Admission: RE | Admit: 2024-01-28 | Discharge: 2024-01-28 | Disposition: A | Discharge status: Home / Self Care | Source: Ambulatory Visit | Attending: Certified Nurse Midwife | Admitting: Certified Nurse Midwife

## 2024-01-28 DIAGNOSIS — Z1231 Encounter for screening mammogram for malignant neoplasm of breast: Secondary | ICD-10-CM | POA: Diagnosis present
